# Patient Record
Sex: Female | Born: 1980 | Race: Black or African American | Hispanic: No
Health system: Southern US, Community
[De-identification: ages and names within clinical notes are randomized; demographics above are authoritative.]

---

## 2011-01-27 ENCOUNTER — Observation Stay: Payer: Self-pay | Admitting: Internal Medicine

## 2011-01-27 LAB — COMPREHENSIVE METABOLIC PANEL
Albumin: 3.9 g/dL (ref 3.4–5.0)
Alkaline Phosphatase: 83 U/L (ref 50–136)
Calcium, Total: 9 mg/dL (ref 8.5–10.1)
Chloride: 105 mmol/L (ref 98–107)
Glucose: 152 mg/dL — ABNORMAL HIGH (ref 65–99)
Osmolality: 283 (ref 275–301)
SGOT(AST): 17 U/L (ref 15–37)
SGPT (ALT): 23 U/L
Sodium: 141 mmol/L (ref 136–145)
Total Protein: 7.2 g/dL (ref 6.4–8.2)

## 2011-01-27 LAB — CBC
MCHC: 33.2 g/dL (ref 32.0–36.0)
RBC: 4.46 10*6/uL (ref 3.80–5.20)
RDW: 12.7 % (ref 11.5–14.5)
WBC: 11.2 10*3/uL — ABNORMAL HIGH (ref 3.6–11.0)

## 2011-01-28 LAB — BASIC METABOLIC PANEL
Anion Gap: 15 (ref 7–16)
Calcium, Total: 9.2 mg/dL (ref 8.5–10.1)
Chloride: 104 mmol/L (ref 98–107)
Creatinine: 1.03 mg/dL (ref 0.60–1.30)
EGFR (African American): >60
EGFR (Non-African Amer.): >60
Glucose: 157 mg/dL — ABNORMAL HIGH (ref 65–99)
Sodium: 139 mmol/L (ref 136–145)

## 2011-01-28 LAB — CBC WITH DIFFERENTIAL/PLATELET
Basophil #: 0 10*3/uL (ref 0.0–0.1)
Basophil %: 0 %
HCT: 37.7 % (ref 35.0–47.0)
Lymphocyte #: 0.6 10*3/uL — ABNORMAL LOW (ref 1.0–3.6)
Lymphocyte %: 6.2 %
MCH: 28.4 pg (ref 26.0–34.0)
MCV: 87 fL (ref 80–100)
Monocyte %: 0.6 %
Neutrophil #: 8.7 10*3/uL — ABNORMAL HIGH (ref 1.4–6.5)
Platelet: 301 10*3/uL (ref 150–440)
RDW: 12.9 % (ref 11.5–14.5)
WBC: 9.4 10*3/uL (ref 3.6–11.0)

## 2012-09-16 IMAGING — CR DG CHEST 1V PORT
1 series · 1 of 1 positions shown · non-contrast
Comparison: none

REASON FOR EXAM: sob
COMMENTS:

PROCEDURE:     DXR - DXR PORTABLE CHEST SINGLE VIEW  - January 27, 2011  [DATE]
RESULT:     The lungs are clear. The cardiac silhouette and visualized bony
skeleton are unremarkable.

[ap]
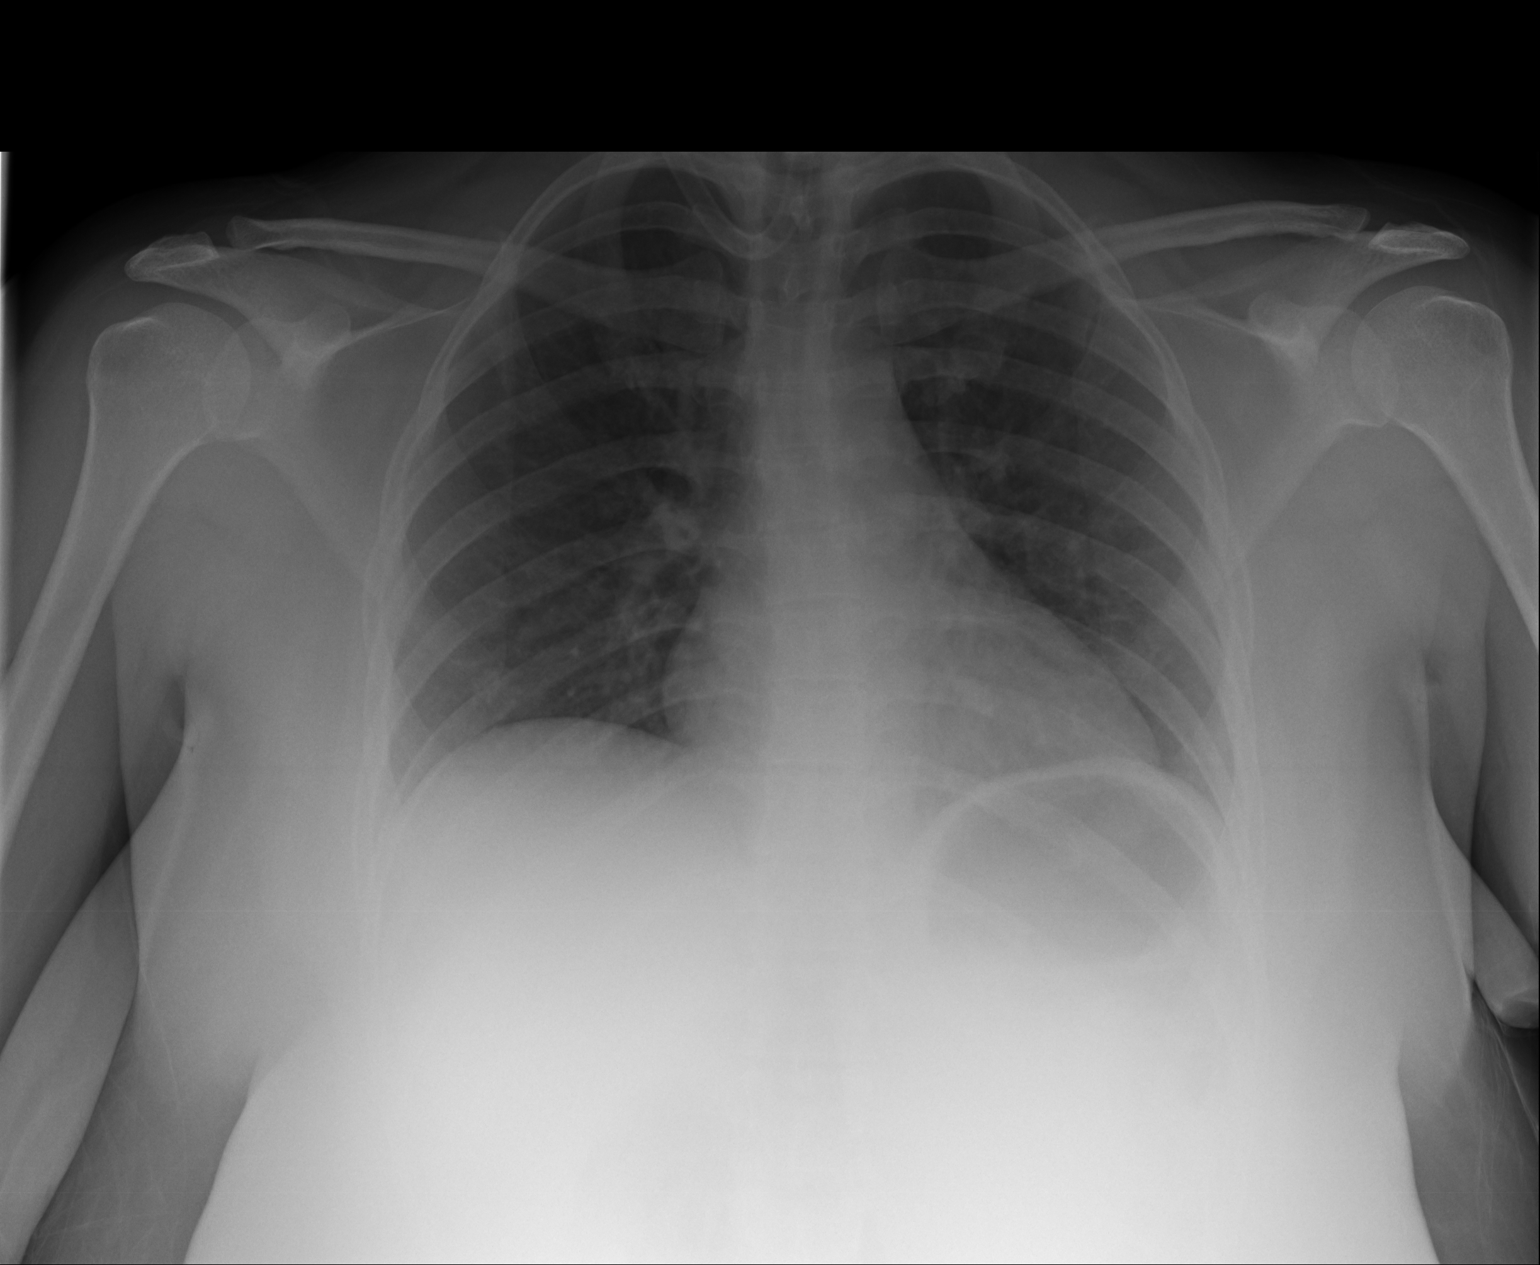

[1 of 1 positions shown; findings below may reference images not displayed]

IMPRESSION: 1. Chest radiograph without evidence of acute cardiopulmonary disease.

## 2014-04-25 NOTE — H&P (Signed)
PATIENT NAME:  Jamie Mcintosh, Jamie Mcintosh MR#:  [id number] DATE OF BIRTH:  [birth year]  DATE OF ADMISSION:  01/27/2011  PRIMARY CARE PHYSICIAN: Vonita Moss, MD   CHIEF COMPLAINT: Asthma exacerbation.   HISTORY OF PRESENT ILLNESS: The patient reports progressive shortness of breath, wheezing, burning chest pain, and cough for the past two weeks. Jamie Mcintosh has been using her Ventolin inhaler several times per day. Her pulmonologist is Dr. Meredeth Ide, and typically Jamie Mcintosh is on Pulmicort 1 puff b.i.d. without needing her rescue inhaler any more than once every 2 to 3 weeks. Jamie Mcintosh has only had one previous severe asthma exacerbation which required intubation in the Intensive Care Unit for four days back in 1998. All other asthma exacerbations have been mild and short-lived. For the past two weeks Jamie Mcintosh has been babysitting a friend's dog but denies any other exposures or upper respiratory infection symptoms. Jamie Mcintosh denies any fevers or chills. In the Emergency Department, Jamie Mcintosh received potassium chloride, methylprednisolone 120 mg IV, Ativan, albuterol and Xopenex.   REVIEW OF SYSTEMS: CONSTITUTIONAL: Jamie Mcintosh denies any fevers, chills, weakness, or fatigue. HEENT: No blurry vision, double vision, or irritation of her eyes. No rhinorrhea, dysphagia, odynophagia or sore throat. No changes in her hearing. No tinnitus or hearing loss. RESPIRATORY: Respiratory system is as described in the history of present illness. CARDIOVASCULAR: No chest pain, orthopnea, paroxysmal nocturnal dyspnea, edema, or palpitations. GI: No nausea, vomiting, diarrhea, or constipation. No melena or hematochezia. GU: No dysuria, hematuria, nocturia. MUSCULOSKELETAL: No weakness. No pain in her joints. No swelling of any joint. SKIN: No rashes, lesions, changes in her skin, hair, or nails. NEUROLOGICAL:  No weakness, headaches, dizziness.   PAST MEDICAL HISTORY: Asthma.   FAMILY HISTORY: Asthma in her father. Hypertension in her mother. Diabetes in her maternal  grandparents. Paternal grandfather had stomach cancer. No heart disease.   SOCIAL HISTORY: No tobacco, occasional alcohol. 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 No occupational exposures.   CODE STATUS:  Jamie Mcintosh is a FULL CODE.  Contact person is her mother.   ALLERGIES: Jamie Mcintosh has no known drug allergies but is allergic to shellfish and dust.   HOME MEDICATIONS:  1. Pulmicort 1 puff b.i.d.  2. Ventolin HFA 2 puffs every 6 hours p.r.n. for shortness of breath.   PHYSICAL EXAMINATION:  VITAL SIGNS: On admission, pulse is 108, respiratory rate 22, blood pressure 114/72, oxygen saturation is 100% on 4 liters nasal cannula.   GENERAL: Jamie Mcintosh is in no acute distress, well-developed, well-nourished.   HEENT: Conjunctivae and lids are without erythema or pallor. Pupils are equally round and reactive to light and accommodation. External inspection of the ears and nose is without lesions or masses. Hearing is intact to whispered voice. Nasal mucosa, septum, and turbinates are without lesions or masses. Lips, teeth, and gums are without lesions or masses. Oropharynx is clear. Oral mucosa is moist. Posterior pharynx is without exudate.   NECK: Supple. Thyroid is not enlarged. There are no nodules.   RESPIRATORY: Jamie Mcintosh is not in respiratory distress but is tachypneic and using accessory muscles. Air movement is diminished throughout with inspiratory and expiratory wheezing along with a prolongation of the expiratory phase.   CARDIOVASCULAR: Heart is regular. PMI is hyperdynamic. S1 and S2 are regular. No murmurs, rubs, or gallops. Pedal pulses are 2+ and equal bilaterally. No edema.   ABDOMEN: Soft, nontender, nondistended. Liver and spleen are not enlarged. Bowel sounds are normoactive. No axillary or cervical lymphadenopathy.   MUSCULOSKELETAL: Inspection  of the digits reveals no clubbing, cyanosis or edema. Jamie Mcintosh has full range of motion in all four extremities with good strength and  tone.   SKIN: Inspection of the skin reveals no rashes or lesions. Subcutaneous tissue reveals no induration or nodules.   NEUROLOGIC: Cranial nerves II through XII are grossly intact. Deep tendon reflexes are 2+ and equal bilaterally.   PSYCHIATRIC: Judgment and insight are intact. The patient is oriented to person, place, and time.   LABORATORY DATA:  White blood cell count is 11.2, hematocrit is 30.5, platelets are 302. Sodium 141, potassium 3.1, chloride 105, bicarbonate 22, BUN 8, creatinine 0.9, glucose 152. Troponin is flat.   ASSESSMENT AND PLAN: A 34 year old woman with asthma who presents with an exacerbation in the setting of babysitting a friend's dog.   1. Asthma exacerbation: Steroids and albuterol nebulizers. O2 p.r.n. to keep oxygen saturation greater than 94%.  2. Hypokalemia: Repleted in the ED. We will need to recheck a BMP in the morning.   TIME SPENT:   Total time spent on care and coordination was 40 minutes.   ____________________________ Lise AuerJennifer L. Manson PasseyBrown, MD jlb:cbb D: 01/27/2011 21:52:40 ET T: 01/28/2011 10:12:27 ET JOB#: [id number]  cc: Victorino DikeJennifer L. Manson PasseyBrown, MD, <Dictator> Steele SizerMark A. Crissman, MD Eber HongJENNIFER L Hara Milholland MD ELECTRONICALLY SIGNED 01/30/2011 13:37

## 2014-04-25 NOTE — Discharge Summary (Signed)
PATIENT NAME:  Jamie Mcintosh, Jamie Mcintosh MR#:  [id number] DATE OF BIRTH:  [birth year]  DATE OF ADMISSION:  01/27/2011 DATE OF DISCHARGE:  01/28/2011  PRESENTING COMPLAINT: Asthma exacerbation.   DISCHARGE DIAGNOSIS: Asthma exacerbation.   CONDITION ON DISCHARGE: Fair. Vitals stable. Sats were 99% on room air, 94% on exertion.   MEDICATIONS:  1. Pulmicort 90 mcg/inhalation, 1 puff b.i.d.  2. Ventolin HFA 2 puffs every four hours as needed.  3. Prednisone taper.   FOLLOWUP: Follow up with Dr. Dossie Arbourrissman in 1 to 2 weeks.   LABORATORY AND RADIOLOGICAL DATA:  Basic metabolic panel within normal limits except bicarbonate of 20, glucose of 157. BUN of 6.   Chest x-ray: No acute cardiopulmonary abnormality. Troponin was 0.02. CBC within normal limits except white count of 11.2. LFTs within normal limits.   HISTORY OF PRESENT ILLNESS: Jamie Mcintosh is a pleasant 34 year old female with history of asthma who came in with:  1. Asthma exacerbation: The patient reported she had lately been baby-sitting a friend's dog. She started having increasing wheezing along with chest tightness and per mother had possible syncopal episode due to her shortness of breath. She was started on IV steroids and nebulizer treatments were given. Her inhalers were continued. Chest x-ray remained clear. The patient remained afebrile. Her symptoms improved remarkably and her sats remained stable prior to discharge. The patient was ambulated. On exertion her sats remained stable, more than 92% on room air.   2. Hypokalemia: Potassium was repleted.  3. The discharge plan was discussed with the patient's mother and the patient.  The patient will follow up with Dr. Dossie Arbourrissman in a week or earlier if her symptoms recur.  Hospital stay otherwise  remained stable.  4. She remained a FULL CODE.   TIME SPENT: 40 minutes.    ____________________________ Wylie HailSona A. Allena KatzPatel, MD sap:bjt D: 02/03/2011 15:48:08 ET T: 02/05/2011 12:42:53  ET JOB#: [id number]  cc: Nora Sabey A. Allena KatzPatel, MD, <Dictator> Steele SizerMark A. Crissman, MD Willow OraSONA A Shanita Kanan MD ELECTRONICALLY SIGNED 02/08/2011 7:40
